# Patient Record
Sex: Female | Born: 1990 | Race: Black or African American | Hispanic: No | Marital: Single | Smoking: Never smoker
Health system: Southern US, Community
[De-identification: ages and names within clinical notes are randomized; demographics above are authoritative.]

---

## 2015-03-30 ENCOUNTER — Emergency Department (HOSPITAL_COMMUNITY)
Admission: EM | Admit: 2015-03-30 | Discharge: 2015-03-30 | Disposition: A | Discharge status: Home / Self Care | Payer: BC Managed Care – PPO | Attending: Emergency Medicine | Admitting: Emergency Medicine

## 2015-03-30 ENCOUNTER — Encounter (HOSPITAL_COMMUNITY): Payer: Self-pay | Admitting: Emergency Medicine

## 2015-03-30 DIAGNOSIS — R110 Nausea: Secondary | ICD-10-CM | POA: Diagnosis not present

## 2015-03-30 DIAGNOSIS — R1084 Generalized abdominal pain: Secondary | ICD-10-CM | POA: Diagnosis present

## 2015-03-30 DIAGNOSIS — Z3202 Encounter for pregnancy test, result negative: Secondary | ICD-10-CM | POA: Insufficient documentation

## 2015-03-30 DIAGNOSIS — A5901 Trichomonal vulvovaginitis: Secondary | ICD-10-CM | POA: Insufficient documentation

## 2015-03-30 LAB — URINALYSIS, ROUTINE W REFLEX MICROSCOPIC
Bilirubin Urine: NEGATIVE
GLUCOSE, UA: NEGATIVE mg/dL
Hgb urine dipstick: NEGATIVE
Ketones, ur: NEGATIVE mg/dL
LEUKOCYTES UA: NEGATIVE
Nitrite: NEGATIVE
PROTEIN: NEGATIVE mg/dL
SPECIFIC GRAVITY, URINE: 1.023 (ref 1.005–1.030)
Urobilinogen, UA: 2 mg/dL — ABNORMAL HIGH (ref 0.0–1.0)
pH: 6 (ref 5.0–8.0)

## 2015-03-30 LAB — COMPREHENSIVE METABOLIC PANEL
ALBUMIN: 4.8 g/dL (ref 3.5–5.2)
ALT: 18 U/L (ref 0–35)
ANION GAP: 7 (ref 5–15)
AST: 26 U/L (ref 0–37)
Alkaline Phosphatase: 51 U/L (ref 39–117)
BILIRUBIN TOTAL: 1 mg/dL (ref 0.3–1.2)
BUN: 12 mg/dL (ref 6–23)
CALCIUM: 9.1 mg/dL (ref 8.4–10.5)
CO2: 27 mmol/L (ref 19–32)
CREATININE: 0.85 mg/dL (ref 0.50–1.10)
Chloride: 104 mmol/L (ref 96–112)
GFR calc Af Amer: >90 mL/min (ref >90)
GFR calc non Af Amer: >90 mL/min (ref >90)
Glucose, Bld: 105 mg/dL — ABNORMAL HIGH (ref 70–99)
Potassium: 3.9 mmol/L (ref 3.5–5.1)
Sodium: 138 mmol/L (ref 135–145)
Total Protein: 8.1 g/dL (ref 6.0–8.3)

## 2015-03-30 LAB — WET PREP, GENITAL: YEAST WET PREP: NONE SEEN

## 2015-03-30 LAB — CBC WITH DIFFERENTIAL/PLATELET
BASOS ABS: 0 10*3/uL (ref 0.0–0.1)
Basophils Relative: 0 % (ref 0–1)
EOS ABS: 0.1 10*3/uL (ref 0.0–0.7)
Eosinophils Relative: 3 % (ref 0–5)
HCT: 43.5 % (ref 36.0–46.0)
HEMOGLOBIN: 14.4 g/dL (ref 12.0–15.0)
Lymphocytes Relative: 35 % (ref 12–46)
Lymphs Abs: 1.9 10*3/uL (ref 0.7–4.0)
MCH: 32.2 pg (ref 26.0–34.0)
MCHC: 33.1 g/dL (ref 30.0–36.0)
MCV: 97.3 fL (ref 78.0–100.0)
MONOS PCT: 15 % — AB (ref 3–12)
Monocytes Absolute: 0.8 10*3/uL (ref 0.1–1.0)
Neutro Abs: 2.5 10*3/uL (ref 1.7–7.7)
Neutrophils Relative %: 47 % (ref 43–77)
Platelets: 242 10*3/uL (ref 150–400)
RBC: 4.47 MIL/uL (ref 3.87–5.11)
RDW: 13.3 % (ref 11.5–15.5)
WBC: 5.3 10*3/uL (ref 4.0–10.5)

## 2015-03-30 LAB — POC URINE PREG, ED: PREG TEST UR: NEGATIVE

## 2015-03-30 MED ORDER — DOXYCYCLINE HYCLATE 100 MG PO CAPS: 100.0000 mg | ORAL_CAPSULE | Freq: Two times a day (BID) | ORAL | Status: DC

## 2015-03-30 MED ORDER — LIDOCAINE HCL 1 % IJ SOLN
INTRAMUSCULAR | Status: AC
  Administered 2015-03-30: 20 mL
  Filled 2015-03-30: qty 20

## 2015-03-30 MED ORDER — CEFTRIAXONE SODIUM 250 MG IJ SOLR
250.0000 mg | Freq: Once | INTRAMUSCULAR | Status: AC
  Administered 2015-03-30: 250 mg via INTRAMUSCULAR
  Filled 2015-03-30: qty 250

## 2015-03-30 MED ORDER — METRONIDAZOLE 500 MG PO TABS: 500.0000 mg | ORAL_TABLET | Freq: Two times a day (BID) | ORAL | Status: DC

## 2015-03-30 MED ORDER — ONDANSETRON 4 MG PO TBDP
4.0000 mg | ORAL_TABLET | Freq: Once | ORAL | Status: AC
  Administered 2015-03-30: 4 mg via ORAL
  Filled 2015-03-30: qty 1

## 2015-03-30 NOTE — Progress Notes (Signed)
EDCM spoke to patient at bedside.  Patient confirms she has [employer] insurance without a pcp.  [facility] with list of pcps who accept BCBS insurance within a twenty mile radius of patient's zip code [phone].  EDCM discussed importance and purpose of having a pcp.  EDCM also discussed use of ED for emergency situations and use of Urgent Care centers.  Patient thankful for resources.  No further EDCM needs at this time.

## 2015-03-30 NOTE — ED Provider Notes (Signed)
CSN: [id number]     Arrival date & time 03/30/15  1806 History   First MD Initiated Contact with Patient 03/30/15 1924     Chief Complaint  Patient presents with  . Pelvic Pain  . Abdominal Pain     (Consider location/radiation/quality/duration/timing/severity/associated sxs/prior Treatment) HPI Ruth Robles is a 24 year old female with no past medical history who presents the ER complaining of generalized abdominal discomfort, pelvic discomfort. Patient states generalized discomfort in the past 2 days, with associated nausea today. Patient states she recently has had a new sexual partner, for the past 2 weeks. Patient states she is aware that her partner has had unprotected sex recently, and she is concerned about having possibly an STD. Patient reports associated mild abdominal discomfort. Patient denies vaginal bleeding, vaginal pain, vaginal discomfort or discharge. Patient denies vomiting, diarrhea, dysuria.  History reviewed. No pertinent past medical history. History reviewed. No pertinent past surgical history. No family history on file. History  Substance Use Topics  . Smoking status: Never Smoker   . Smokeless tobacco: Current User  . Alcohol Use: Yes   OB History    No data available     Review of Systems  Constitutional: Negative for fever.  HENT: Negative for trouble swallowing.   Eyes: Negative for visual disturbance.  Respiratory: Negative for shortness of breath.   Cardiovascular: Negative for chest pain.  Gastrointestinal: Positive for nausea and abdominal pain. Negative for vomiting.  Genitourinary: Negative for dysuria.  Musculoskeletal: Negative for neck pain.  Skin: Negative for rash.  Neurological: Negative for dizziness, weakness and numbness.  Psychiatric/Behavioral: Negative.       Allergies  Review of patient's allergies indicates no known allergies.  Home Medications   Prior to Admission medications   Medication Sig Start Date End Date  Taking? Authorizing Provider  acetaminophen (TYLENOL) 500 MG tablet Take 500 mg by mouth every 6 (six) hours as needed for headache.   Yes Historical Provider, MD  doxycycline (VIBRAMYCIN) 100 MG capsule Take 1 capsule (100 mg total) by mouth 2 (two) times daily. One po bid x 14 days 03/30/15   Ladona Mow, PA-C  metroNIDAZOLE (FLAGYL) 500 MG tablet Take 1 tablet (500 mg total) by mouth 2 (two) times daily. One po bid x 14 days 03/30/15   Ladona Mow, PA-C   BP 140/90 mmHg  Pulse 85  Temp(Src) 98.3 F (36.8 C) (Oral)  Resp 16  SpO2 100%  LMP 02/15/2015 Physical Exam  Constitutional: She is oriented to person, place, and time. She appears well-developed and well-nourished. No distress.  HENT:  Head: Normocephalic and atraumatic.  Mouth/Throat: Oropharynx is clear and moist. No oropharyngeal exudate.  Eyes: Right eye exhibits no discharge. Left eye exhibits no discharge. No scleral icterus.  Neck: Normal range of motion.  Cardiovascular: Normal rate, regular rhythm and normal heart sounds.   No murmur heard. Pulmonary/Chest: Effort normal and breath sounds normal. No respiratory distress.  Abdominal: Soft. Normal appearance and bowel sounds are normal. There is generalized tenderness. There is no rigidity, no guarding, no tenderness at McBurney's point and negative Murphy's sign.  Mild, generalized tenderness. No point tenderness, rebound or guarding  Genitourinary: There is no rash, tenderness, lesion or injury on the right labia. There is no rash, tenderness, lesion or injury on the left labia. Cervix exhibits no motion tenderness, no discharge and no friability. Right adnexum displays no mass, no tenderness and no fullness. Left adnexum displays no mass, no tenderness and no fullness. No erythema, tenderness  or bleeding in the vagina. No foreign body around the vagina. No signs of injury around the vagina. Vaginal discharge found.  Mild amount of white/gray colored discharge noted in vaginal  vault. No cervical motion tenderness, friability or discharge. No adnexal tenderness. Chaperone present during entire pelvic exam.  Musculoskeletal: Normal range of motion. She exhibits no edema or tenderness.  Neurological: She is alert and oriented to person, place, and time. She has normal strength. No cranial nerve deficit or sensory deficit. Coordination normal. GCS eye subscore is 4. GCS verbal subscore is 5. GCS motor subscore is 6.  Skin: Skin is warm and dry. No rash noted. She is not diaphoretic.  Psychiatric: She has a normal mood and affect.  Nursing note and vitals reviewed.   ED Course  Procedures (including critical care time) Labs Review Labs Reviewed  WET PREP, GENITAL - Abnormal; Notable for the following:    Trich, Wet Prep MANY (*)    Clue Cells Wet Prep HPF POC FEW (*)    WBC, Wet Prep HPF POC RARE (*)    All other components within normal limits  COMPREHENSIVE METABOLIC PANEL - Abnormal; Notable for the following:    Glucose, Bld 105 (*)    All other components within normal limits  CBC WITH DIFFERENTIAL/PLATELET - Abnormal; Notable for the following:    Monocytes Relative 15 (*)    All other components within normal limits  URINALYSIS, ROUTINE W REFLEX MICROSCOPIC - Abnormal; Notable for the following:    APPearance CLOUDY (*)    Urobilinogen, UA 2.0 (*)    All other components within normal limits  RPR  HIV ANTIBODY (ROUTINE TESTING)  POC URINE PREG, ED  GC/CHLAMYDIA PROBE AMP ([place])    Imaging Review No results found.   EKG Interpretation None      MDM   Final diagnoses:  Trichomonal vaginitis    Patient here with concern for STD, mild generalized abdominal discomfort. Patient also describes nausea. Patient's symptoms improved after Zofran here. Pelvic exam was benign, there is no concern for tubo-ovarian abscess, ectopic pregnancy, ovarian torsion or PID. RPR and HIV blood draws sent as well as GC Chlamydia swab.  Patient's lab work  unremarkable for a wet prep positive for Trichomonas. Plan to treat patient with Flagyl for this. Given the fact the patient was having some mild abdominal discomfort, we'll treat for PID despite the fact that there is low concern for PID on exam. Labs otherwise unremarkable for acute pathology. Patient hemodynamically stable, well-appearing on exam and in no acute distress. Discussed treatment of PID with patient, encouraged her to not use any alcohol, encouraged follow-up with the health department as well as with a primary care provider. Discussed return precautions with patient, patient verbalized understanding and agreement of this plan. Encouraged patient to call or return to the ER if any worsening of symptoms or should she have any questions or concerns.  BP 140/90 mmHg  Pulse 85  Temp(Src) 98.3 F (36.8 C) (Oral)  Resp 16  SpO2 100%  LMP 02/15/2015  Signed,  Ladona MowJoe Lucky Alverson, PA-C 12:24 AM  Patient discussed with Dr. Tilden FossaElizabeth Rees, MD  Ladona MowJoe Tarus Briski, PA-C 03/31/15 0025  Tilden FossaElizabeth Rees, MD 03/31/15 [phone]

## 2015-03-30 NOTE — ED Notes (Signed)
Patient states she gave someone her urine already, I am looking for it

## 2015-03-30 NOTE — ED Notes (Signed)
Nurse drawing labs. 

## 2015-03-30 NOTE — ED Notes (Signed)
Pt c/o pelvic pain and white patch on roof of mouth x 4-5 days. Pt has a new sexual partner, they have been sexually active x 2 weeks. Pt denies any discharge or foul smelling odor. Pt denies vaginal bleeding or burning with urination. A&Ox4 and ambulatory.

## 2015-03-30 NOTE — Discharge Instructions (Signed)
Trichomoniasis Trichomoniasis is an infection caused by an organism called Trichomonas. The infection can affect both women and men. In women, the outer female genitalia and the vagina are affected. In men, the penis is mainly affected, but the prostate and other reproductive organs can also be involved. Trichomoniasis is a sexually transmitted infection (STI) and is most often passed to another person through sexual contact.  RISK FACTORS  Having unprotected sexual intercourse.  Having sexual intercourse with an infected partner. SIGNS AND SYMPTOMS  Symptoms of trichomoniasis in women include:  Abnormal gray-green frothy vaginal discharge.  Itching and irritation of the vagina.  Itching and irritation of the area outside the vagina. Symptoms of trichomoniasis in men include:   Penile discharge with or without pain.  Pain during urination. This results from inflammation of the urethra. DIAGNOSIS  Trichomoniasis may be found during a Pap test or physical exam. Your health care provider may use one of the following methods to help diagnose this infection:  Examining vaginal discharge under a microscope. For men, urethral discharge would be examined.  Testing the pH of the vagina with a test tape.  Using a vaginal swab test that checks for the Trichomonas organism. A test is available that provides results within a few minutes.  Doing a culture test for the organism. This is not usually needed. TREATMENT   You may be given medicine to fight the infection. Women should inform their health care provider if they could be or are pregnant. Some medicines used to treat the infection should not be taken during pregnancy.  Your health care provider may recommend over-the-counter medicines or creams to decrease itching or irritation.  Your sexual partner will need to be treated if infected. HOME CARE INSTRUCTIONS   Take medicines only as directed by your health care provider.  Take  over-the-counter medicine for itching or irritation as directed by your health care provider.  Do not have sexual intercourse while you have the infection.  Women should not douche or wear tampons while they have the infection.  Discuss your infection with your partner. Your partner may have gotten the infection from you, or you may have gotten it from your partner.  Have your sex partner get examined and treated if necessary.  Practice safe, informed, and protected sex.  See your health care provider for other STI testing. SEEK MEDICAL CARE IF:   You still have symptoms after you finish your medicine.  You develop abdominal pain.  You have pain when you urinate.  You have bleeding after sexual intercourse.  You develop a rash.  Your medicine makes you sick or makes you throw up (vomit). MAKE SURE YOU:  Understand these instructions.  Will watch your condition.  Will get help right away if you are not doing well or get worse. Document Released: 05/28/2001 Document Revised: 04/18/2014 Document Reviewed: 09/13/2013 [facility] Patient Information 2015 [employer], [place]. This information is not intended to replace advice given to you by your health care provider. Make sure you discuss any questions you have with your health care provider.   Emergency Department Resource Guide 1) Find a Doctor and Pay Out of Pocket Although you won't have to find out who is covered by your insurance plan, it is a good idea to ask around and get recommendations. You will then need to call the office and see if the doctor you have chosen will accept you as a new patient and what types of options they offer for patients who are  self-pay. Some doctors offer discounts or will set up payment plans for their patients who do not have insurance, but you will need to ask so you aren't surprised when you get to your appointment.  2) Contact Your Local Health Department Not all health departments have doctors  that can see patients for sick visits, but many do, so it is worth a call to see if yours does. If you don't know where your local health department is, you can check in your phone book. The CDC also has a tool to help you locate your state's health department, and many state websites also have listings of all of their local health departments.  3) Find a Walk-in Clinic If your illness is not likely to be very severe or complicated, you may want to try a walk in clinic. These are popping up all over the country in pharmacies, drugstores, and shopping centers. They're usually staffed by nurse practitioners or physician assistants that have been trained to treat common illnesses and complaints. They're usually fairly quick and inexpensive. However, if you have serious medical issues or chronic medical problems, these are probably not your best option.  No Primary Care Doctor: - Call Health Connect at  [phone] - they can help you locate a primary care doctor that  accepts your insurance, provides certain services, etc. - Physician Referral Service- [phone]  Chronic Pain Problems: Organization         Address  Phone   Notes  [facility]  [phone] Patients need to be referred by their primary care doctor.   Medication Assistance: Organization         Address  Phone   Notes  [facility] Medication [facility] [address] [place] [place], [place] [address] [phone] --Must be a resident of [facility] -- Must have NO insurance coverage whatsoever (no Medicaid/ Medicare, etc.) -- The pt. MUST have a primary care doctor that directs their care regularly and follows them in the community   MedAssist  [phone]   [employer]  [phone]    Agencies that provide inexpensive medical care: Organization         Address  Phone   Notes  [facility]  [phone]   [facility]    [phone]   [facility] [address] [place], [place] [address] [phone]   [facility] [address] [place] [place] [phone]   Planned Parenthood    [phone]   [facility]    [phone]   [facility]  [address], [place] Phone:  [phone], Fax:  [phone] Hours of Operation:  9 am - 6 pm, M-F.  Also accepts Medicaid/Medicare and self-pay.  [facility]  [address], [place] Phone: [phone], Fax: [phone]. Hours of Operation:  8:30 am - 5:30 pm, M-F.  Also accepts Medicaid and self-pay.  [facility] [place] [address], [place] Phone: [phone]   [facility] [address] Natasha Bence [place], [place] [phone], Ext. [phone] Mondays & Thursdays: 7-9 AM.  First 15 patients are seen on a first come, first serve basis.    Medicaid-accepting [facility] Providers:  Organization         Address  Phone   Notes  [facility] [address], [address], [place] [phone] Also accepts self-pay patients.  [facility] [address] [place], [place]  [phone]) [phone]   [facility] [address] [place] [address], [place] [phone]   [facility] [address] [place] [address], [place] [phone]) [phone]   Renaye RakersVeita Bland [address], [place]   [phone] Only accepts [place] Access [place]icaid patients after they have their name applied to their card.   Self-Pay (no insurance) in [facility]:  Organization         Address  Phone   Notes  Sickle Cell Patients, [facility] [address], [place] [phone]) [phone]   [facility] [address], [place] [phone]) [phone]   [facility] [place]  [address], [place] [phone]   [facility]/Dr.  Osei-Bonsu  [address] [place] [address], [place] or [phone], [place] [phone] Phone number for both [place] and [place] locations is the same.  [facility] [address], [place] [phone]) [phone]   [facility] [address] [place] [address], [place] or [address] [phone]   [facility] [address], [place] [phone]) [phone], phone; [phone], fax Sees patients 1st and 3rd Saturday of every month.  Must not qualify for public or private insurance (i.e. Medicaid, Medicare, [place] Health Choice, Veterans' Benefits)  Household income should be no more than 200% of the poverty level The clinic cannot treat you if you are pregnant or think you are pregnant  Sexually transmitted diseases are not treated at the clinic.    Dental Care: Organization         Address  Phone  Notes  [facility] [address], [place] [phone] Accepts children up to age 26 who are enrolled in [place]icaid or [place] Health Choice; pregnant women with a Medicaid card; and children who have applied for Medicaid or [place] Health Choice, but were declined, whose parents can pay a reduced fee at time of service.  [facility] [place]  [address], [place] [phone] Accepts children up to age 26 who are enrolled in [place] or [place] Health Choice; pregnant women with a Medicaid card; and children who have applied for Medicaid or [place] Health Choice, but were declined, whose parents can pay a reduced fee at time of service.  [facility]  [address], [place] [phone] Patients are seen by appointment only. Walk-ins are not accepted. [facility] will see patients 218 years of age and older. Monday - Tuesday (8am-5pm) Most Wednesdays (8:30-5pm) $30 per visit, cash only  [facility]  [facility]  [address], [facility] [phone] Patients are seen by appointment only. Walk-ins are not accepted. [facility] will see patients 26 years of age and older. One Wednesday Evening (Monthly: Volunteer Based).  $30 per visit, cash only  [employer] [facility]  [phone] for adults; Children under age 624, call Graduate Pediatric Dentistry at [phone]. Children aged 314-14, please call [phone] to request a pediatric application.  Dental services are provided in all areas of dental care including fillings, crowns and bridges, complete and partial dentures, implants, gum treatment, root canals, and  extractions. Preventive care is also provided. Treatment is provided to both adults and children. Patients are selected via a lottery and there is often a waiting list.   [facility] [address] [place]  [phone] [email or web address]   [facility] [address] [place], [place] [phone], Ext. [phone] Second and Fourth Thursday of each month, opens at 6:30 AM; Clinic ends at 9 AM.  Patients are seen on a first-come first-served basis, and a limited number are seen during each clinic.   [facility]  [address] [place] [place], [place] [phone]   Eligibility Requirements You must have lived in [place], [place], or [place] counties for at least the last three months.   You cannot be eligible for state or federal sponsored [place], including [employer], [place], or [employer].   You generally cannot be eligible for healthcare insurance through your employer.    How to apply: Eligibility screenings are held every Tuesday and Wednesday afternoon from 1:00 pm until 4:00 pm. You do not need an appointment for the interview!  [facility] [address], [place], [place] [phone]   [facility]  [phone]   [facility]  Health Department  [phone]   [facility]  [phone]    Behavioral Health Resources in the Community: Intensive Outpatient Programs Organization         Address  Phone  Notes  [facility] [address] [place], [place] [phone]   [facility] Outpatient [address] [place], [place] [phone]   [facility]: [facility] [address] [place], [place]  [phone]   [facility] [address]  [place], [place] [phone] or [phone]   Substance Abuse Resources Organization         Address  Phone  Notes  Alcohol [facility] Drug Services  [phone]   [facility]  [phone]   The [place]  [phone]   Floydene Flock  [phone]   Residential & Outpatient Substance Abuse Program  [phone]   Psychological Services Organization         Address  Phone  Notes  [facility] Behavioral Health  [phone]   [facility]  [phone]   [facility] [address] [place] [phone] or [phone]    Mobile Crisis Teams Organization         Address  Phone  Notes  [facility], Mobile Crisis Care Unit  [phone]   [facility]  [address] [place], [place] [phone]   Doristine Locks [address] [place] [phone]    Self-Help/Support Groups Organization         Address  Phone             Notes  [facility] [place] - variety of support groups  [phone]- [phone] Call for more information  Narcotics Anonymous (NA), [facility] [address], [employer] [place]  2 meetings at this location   Statistician         Address  Phone  Notes  [facility] [address],    [place]  [phone]   [facility]  [address] [place] [address], [place], [place] [phone]   [facility] Treatment  Facility [address] [place], [place] [phone] Admissions: 8am-3pm M-F  [facility] [address]    [place], [place] [phone]   [facility] [address], [place], [place] [phone]   [facility] [address]  [place], [place] [phone]   [facility] - Intensive Outpatient [address], [place], [place] [phone]   [facility] ([facility]iction [facility] [address]  [place], [place] [phone] or [phone]   [facility] (RT[facility]) [address] [place], [place] [phone] Accepts Medicaid  [facility] [address]  [place] [phone] Substance Abuse/Addiction Treatment   [facility] Behavioral Health Resources Organization         Address  Phone  Notes  [facility]  [phone]) [phone]   Angie FavaJulie Brannon, PhD [address] [place] [address] [place], [place]   [phone] or [phone](336) [phone]   [facility]    [address] [place], [place] [phone](336) [phone]   [facility] [address], [place], [place] [phone]) [phone] Insurance/Medicaid/sponsorship through [facility]  [facility] Families [address]                                    [place], [place] [phone]) [phone] Therapy/tele-psych/case  [facility] [address].   [place], [place] [phone]) [phone]    Dr. Lolly MustacheArfeen  [phone]) [phone]   [facility]  [employer] [facility] 1) [address], [place] 2) [address], [place] 3)  [address], [place] [phone]) [phone]) [phone]  [phone]) [phone]   [facility] Child Abuse Hotline [phone]) [phone] or [phone](336) [phone] (After Hours)

## 2015-03-31 LAB — RPR: RPR: NONREACTIVE

## 2015-03-31 LAB — HIV ANTIBODY (ROUTINE TESTING W REFLEX): HIV SCREEN 4TH GENERATION: NONREACTIVE

## 2015-03-31 LAB — GC/CHLAMYDIA PROBE AMP (~~LOC~~) NOT AT ARMC
CHLAMYDIA, DNA PROBE: NEGATIVE
NEISSERIA GONORRHEA: NEGATIVE

## 2015-10-28 ENCOUNTER — Emergency Department (HOSPITAL_COMMUNITY)
Admission: EM | Admit: 2015-10-28 | Discharge: 2015-10-28 | Disposition: A | Discharge status: Home / Self Care | Payer: BC Managed Care – PPO | Attending: Emergency Medicine | Admitting: Emergency Medicine

## 2015-10-28 ENCOUNTER — Encounter (HOSPITAL_COMMUNITY): Payer: Self-pay | Admitting: *Deleted

## 2015-10-28 DIAGNOSIS — Z202 Contact with and (suspected) exposure to infections with a predominantly sexual mode of transmission: Secondary | ICD-10-CM | POA: Diagnosis present

## 2015-10-28 DIAGNOSIS — N898 Other specified noninflammatory disorders of vagina: Secondary | ICD-10-CM | POA: Insufficient documentation

## 2015-10-28 DIAGNOSIS — Z3202 Encounter for pregnancy test, result negative: Secondary | ICD-10-CM | POA: Diagnosis not present

## 2015-10-28 DIAGNOSIS — N949 Unspecified condition associated with female genital organs and menstrual cycle: Secondary | ICD-10-CM

## 2015-10-28 LAB — URINALYSIS, ROUTINE W REFLEX MICROSCOPIC
Bilirubin Urine: NEGATIVE
GLUCOSE, UA: NEGATIVE mg/dL
Hgb urine dipstick: NEGATIVE
Ketones, ur: NEGATIVE mg/dL
LEUKOCYTES UA: NEGATIVE
NITRITE: NEGATIVE
PH: 7.5 (ref 5.0–8.0)
Protein, ur: NEGATIVE mg/dL
SPECIFIC GRAVITY, URINE: 1.014 (ref 1.005–1.030)
Urobilinogen, UA: 1 mg/dL (ref 0.0–1.0)

## 2015-10-28 LAB — WET PREP, GENITAL
Clue Cells Wet Prep HPF POC: NONE SEEN
TRICH WET PREP: NONE SEEN
WBC WET PREP: NONE SEEN
Yeast Wet Prep HPF POC: NONE SEEN

## 2015-10-28 LAB — POC URINE PREG, ED: Preg Test, Ur: NEGATIVE

## 2015-10-28 MED ORDER — AZITHROMYCIN 250 MG PO TABS
1000.0000 mg | ORAL_TABLET | Freq: Once | ORAL | Status: AC
  Administered 2015-10-28: 1000 mg via ORAL
  Filled 2015-10-28: qty 4

## 2015-10-28 MED ORDER — CEFTRIAXONE SODIUM 250 MG IJ SOLR
250.0000 mg | Freq: Once | INTRAMUSCULAR | Status: AC
  Administered 2015-10-28: 250 mg via INTRAMUSCULAR
  Filled 2015-10-28: qty 250

## 2015-10-28 MED ORDER — STERILE WATER FOR INJECTION IJ SOLN
INTRAMUSCULAR | Status: AC
  Filled 2015-10-28: qty 10

## 2015-10-28 NOTE — ED Notes (Signed)
Requesting to be screened for std, having itching and bumps x 2 days.

## 2015-10-28 NOTE — ED Provider Notes (Signed)
CSN: [id number]     Arrival date & time 10/28/15  1002 History   First MD Initiated Contact with Patient 10/28/15 1010     Chief Complaint  Patient presents with  . SEXUALLY TRANSMITTED DISEASE     (Consider location/radiation/quality/duration/timing/severity/associated sxs/prior Treatment) The history is provided by the patient, medical records and a significant other. No language interpreter was used.  Ruth Robles is a 24 y.o. female  with a PMH of trich presents to the Emergency Department complaining of vaginal itching and burning x 2 days. Associated symptoms include lower abdominal "pressure" and urinary frequency. Pt. Denies fever, chills, vaginal bleeding, vaginal discharge. She was diagnosed with trich in April and finished full course of Flagyl. Sexually active with long term female partner, no new sexual partners.    History reviewed. No pertinent past medical history. History reviewed. No pertinent past surgical history. History reviewed. No pertinent family history. Social History  Substance Use Topics  . Smoking status: Never Smoker   . Smokeless tobacco: Current User  . Alcohol Use: Yes   OB History    No data available     Review of Systems  Constitutional: Negative.   HENT: Negative for congestion, rhinorrhea and sore throat.   Eyes: Negative for visual disturbance.  Respiratory: Negative for cough, shortness of breath and wheezing.   Cardiovascular: Negative.   Gastrointestinal: Negative for nausea, vomiting, abdominal pain, diarrhea and constipation.  Genitourinary: Positive for frequency and vaginal pain. Negative for hematuria, vaginal bleeding and vaginal discharge.  Musculoskeletal: Negative for myalgias, back pain, arthralgias and neck pain.  Skin: Negative for rash.  Neurological: Negative for dizziness, weakness and headaches.      Allergies  Review of patient's allergies indicates no known allergies.  Home Medications   Prior to Admission  medications   Medication Sig Start Date End Date Taking? Authorizing Provider  acetaminophen (TYLENOL) 500 MG tablet Take 500 mg by mouth every 6 (six) hours as needed for headache.   Yes Historical Provider, MD  doxycycline (VIBRAMYCIN) 100 MG capsule Take 1 capsule (100 mg total) by mouth 2 (two) times daily. One po bid x 14 days Patient not taking: Reported on 10/28/2015 03/30/15   Ladona Mow, PA-C  metroNIDAZOLE (FLAGYL) 500 MG tablet Take 1 tablet (500 mg total) by mouth 2 (two) times daily. One po bid x 14 days Patient not taking: Reported on 10/28/2015 03/30/15   Ladona Mow, PA-C   BP 111/74 mmHg  Pulse 82  Temp(Src) 98.9 F (37.2 C) (Oral)  Resp 18  SpO2 99%  LMP 10/17/2015 Physical Exam  Constitutional: She is oriented to person, place, and time. She appears well-developed and well-nourished.  Alert and in no acute distress  HENT:  Head: Normocephalic and atraumatic.  Cardiovascular: Normal rate, regular rhythm, normal heart sounds and intact distal pulses.  Exam reveals no gallop and no friction rub.   No murmur heard. Pulmonary/Chest: Effort normal and breath sounds normal. No respiratory distress. She has no wheezes. She has no rales. She exhibits no tenderness.  Abdominal: She exhibits no mass. There is no rebound and no guarding.  Abdomen soft, non-distended Suprapubic discomfort with palpation.  Bowel sounds positive in all four quadrants   Genitourinary: Uterus normal.    Cervix exhibits discharge. Cervix exhibits no motion tenderness. Right adnexum displays no mass and no tenderness. Left adnexum displays no mass and no tenderness. No erythema, tenderness or bleeding in the vagina.  White discharge present on cervical exam   Musculoskeletal:  She exhibits no edema.  Neurological: She is alert and oriented to person, place, and time.  Psychiatric: She has a normal mood and affect. Her behavior is normal. Judgment and thought content normal.  Nursing note and vitals  reviewed.   ED Course  Procedures (including critical care time) Labs Review Labs Reviewed  URINALYSIS, ROUTINE W REFLEX MICROSCOPIC (NOT AT [facility]) - Abnormal; Notable for the following:    APPearance HAZY (*)    All other components within normal limits  WET PREP, GENITAL  RPR  HIV ANTIBODY (ROUTINE TESTING)  POC URINE PREG, ED  GC/CHLAMYDIA PROBE AMP ([place]) NOT AT [facility]    Imaging Review No results found. I have personally reviewed and evaluated these images and lab results as part of my medical decision-making.   EKG Interpretation None      MDM   Final diagnoses:  Vaginal burning   Ruth Robles presents with two days of vaginal itching, burning and requesting STD check.   Labs: Negative upreg, negative wet prep; reassuring UA Labs performed here are negative for STD's. HIV, RPR, and G&C were sent. Patient informed that she will received phone call if positive. Due to concerning symptoms/exam and risk factors, discussed prophylactic treatment of STI with patient who agreed with plan of care. Will recommend GYN follow up, especially if symptoms/bumps do not improve   [facility] Center[facility]ime Pilcher Llewelyn Sheaffer, PA-C 10/28/15 1219  Alvira MondayErin Schlossman, MD 10/30/15 2242

## 2015-10-28 NOTE — Discharge Instructions (Signed)
1. Medications: usual home medications 2. Treatment: rest, drink plenty of fluids, practice safe-sex habits 3. Follow Up: Please follow up with your primary doctor in 3 days for discussion of your diagnoses and further evaluation after today's visit; if you do not have a primary care doctor use the resource guide provided to find one; Please return to the ER for worsening symptoms, high fevers or persistent vomiting.  You have been tested for HIV, syphilis, chlamydia and gonorrhea. These results will be available in approximately 3 days. You will be notified if they are positive.   If your results are positive you need to notify all sexual partners so they can be treated as well. The website [email or web address] can be used to send anonymous text messages or emails to alert sexual contacts.   Follow up with your doctor, or OBGYN in regards to today's visit.   SEEK IMMEDIATE MEDICAL CARE IF:  You develop an oral temperature above 102 F (38.9 C), not controlled by medications or lasting more than 2 days.  You develop an increase in pain.  You develop any type of abnormal discharge.  You develop vaginal bleeding and it is not time for your period.  You develop painful intercourse.    Emergency Department Resource Guide 1) Find a Doctor and Pay Out of Pocket Although you won't have to find out who is covered by your insurance plan, it is a good idea to ask around and get recommendations. You will then need to call the office and see if the doctor you have chosen will accept you as a new patient and what types of options they offer for patients who are self-pay. Some doctors offer discounts or will set up payment plans for their patients who do not have insurance, but you will need to ask so you aren't surprised when you get to your appointment.  2) Contact Your Local Health Department Not all health departments have doctors that can see patients for sick visits, but many do, so it is  worth a call to see if yours does. If you don't know where your local health department is, you can check in your phone book. The CDC also has a tool to help you locate your state's health department, and many state websites also have listings of all of their local health departments.  3) Find a Walk-in Clinic If your illness is not likely to be very severe or complicated, you may want to try a walk in clinic. These are popping up all over the country in pharmacies, drugstores, and shopping centers. They're usually staffed by nurse practitioners or physician assistants that have been trained to treat common illnesses and complaints. They're usually fairly quick and inexpensive. However, if you have serious medical issues or chronic medical problems, these are probably not your best option.  No Primary Care Doctor: - Call Health Connect at  [phone] - they can help you locate a primary care doctor that  accepts your insurance, provides certain services, etc. - Physician Referral Service- [phone]  Chronic Pain Problems: Organization         Address  Phone   Notes  [facility]  [phone] Patients need to be referred by their primary care doctor.   Medication Assistance: Organization         Address  Phone   Notes  [facility] Medication [facility] [address] [place] [place], [place] [address] [phone] --Must be a resident of [address]  [place] -- Must have NO insurance coverage whatsoever (no Medicaid/ Medicare, etc.) -- The pt. MUST have a primary care doctor that directs their care regularly and follows them in the community   [facility]  [phone]   [employer]  [phone]    Agencies that provide inexpensive medical care: Organization         Address  Phone   Notes  [facility]  [phone]   [facility]    [phone]   [facility] [address] [place],  [place] [address] [phone]   [facility] [address], [place] [phone]   Planned Parenthood    [phone]   [facility]    [phone]   [facility]  [address], [place] Phone:  [phone], Fax:  [phone] Hours of Operation:  9 am - 6 pm, M-F.  Also accepts Medicaid/Medicare and self-pay.  [facility]  [address], [place] Phone: [phone], Fax: [phone]. Hours of Operation:  8:30 am - 5:30 pm, M-F.  Also accepts Medicaid and self-pay.  [facility] [place] [address] [place] Phone: [phone]   [facility] [address] Natasha Bence [place], [place] [phone], Ext. [phone] Mondays & Thursdays: 7-9 AM.  First 15 patients are seen on a first come, first serve basis.    Medicaid-accepting [facility] Providers:  Organization         Address  Phone   Notes  [facility] [address], [place] [phone] Also accepts self-pay patients.  [facility] [address] Laurell Josephs [place], [place]  [phone]   [facility] [address], [place] [phone]   [facility] [address], [place] [phone]   Renaye Rakers [address], [place]   [phone] Only accepts [place] Access [place] patients after they have their name applied to their card.   Self-Pay (no insurance) in [facility]:  Organization         Address  Phone   Notes  Sickle Cell Patients, [facility] [address] [place], [place] [phone]   [facility] [address] [place], [place] [phone]   [facility] [place]  [address], [place] [phone]   [facility]/Dr. Osei-Bonsu  [address] [place] or [address], [place] [phone] Phone number for both [place] and [place] locations is the same.  [facility] [address] [place] [phone]   [facility] [address], [place] or [address] [phone]   [facility] [address], [place] [phone], phone; [phone], fax Sees patients 1st and 3rd Saturday of every month.  Must not qualify for public or private insurance (i.e. Medicaid, Medicare, [place] Health Choice, Veterans' Benefits)  Household income should be no more than 200% of the poverty level The clinic cannot treat you if you are pregnant or think you are pregnant  Sexually transmitted diseases are not treated at the clinic.    Dental Care: Organization         Address  Phone  Notes  [facility] [address], [place] [phone] Accepts children up to age 26 who are enrolled in [place]icaid or [place] Health Choice; pregnant women with a Medicaid card; and children who have applied for Medicaid or [place] Health Choice, but were declined, whose parents can pay a reduced fee at time of service.  [facility] [place]  [address], [place] [phone] Accepts children up to age 26 who are enrolled in [place] or [place] Health Choice; pregnant women with a Medicaid card; and children who have applied for Medicaid or [place] Health Choice, but were declined, whose parents can pay a reduced fee at time of service.  [facility]  [address], [place] [phone] Patients are seen by appointment only. Walk-ins are not accepted. [facility] will see patients 26 years of age and older. Monday - Tuesday (8am-5pm) Most Wednesdays (8:30-5pm) $30 per visit, cash only  [facility]  [address], [facility] [phone]  Patients are seen by appointment only. Walk-ins are not accepted. [facility] will see patients 26 years of age and older. One Wednesday Evening (Monthly: Volunteer Based).  $30 per visit, cash only  [employer] [facility]  [phone] for adults; Children under age 754, call Graduate Pediatric Dentistry at [phone]. Children aged 94-14, please call [phone] to request a pediatric application.  Dental services are provided in all areas of dental care including fillings, crowns and bridges, complete and partial dentures, implants, gum treatment, root canals, and extractions. Preventive care is also provided. Treatment is provided to both adults and children. Patients are selected via a lottery and there is often a waiting list.   [facility] [address], [place]  [phone] [email or web address]   [facility] [address], [place], [place] [phone], Ext. [phone] Second and Fourth Thursday of each month, opens at 6:30 AM; Clinic ends at 9 AM.  Patients are seen on a first-come first-served basis, and a limited number are seen during each clinic.   [facility]  [address], [place], [place] [phone]   Eligibility Requirements You must have lived in [place], [place], or [place] counties for at least the last three months.   You cannot be eligible for state or federal sponsored [place] Cityhealthcare insurance, including CIGNAVeterans Administration, [place]icaid, or [employer]icare.   You generally cannot be eligible for healthcare insurance through your employer.    How to apply: Eligibility screenings are held every Tuesday and Wednesday afternoon from 1:00 pm until 4:00 pm. You do not need an appointment for the interview!  [facility] [address], [place], [place] [phone]   [facility]  [phone]   [facility]  [phone]   [facility]   [phone]    Behavioral Health Resources in the Community: Intensive Outpatient Programs Organization         Address  Phone  Notes  [facility] [address], [place], [place] [phone]   [facility] [address], [place], [place] [phone]   [facility] [address], [place], [place]  [phone]   [facility] [address],  [place], [place] [phone] or [phone]   Substance Abuse Resources Organization  Address  Phone  Notes  [facility]  [phone]   [facility]  [phone]   [facility] [place]  [phone]   Floydene Flock  [phone]   Residential & Outpatient Substance Abuse Program  [phone]   Psychological Services Organization         Address  Phone  Notes  [facility]  [phone]   [facility]  [phone]   [facility] [address], [place] [phone] or [phone]    Mobile Crisis Teams Organization         Address  Phone  Notes  [facility], Mobile Crisis Care Unit  [phone]   [facility]  [address] [place], [place] [phone]   Doristine Locks [address] [place] [phone]    Self-Help/Support Groups Organization         Address  Phone             Notes  [facility] [place] - variety of support groups  [phone]- I7437963 Call for more information  Narcotics Anonymous (NA), [facility] [address], [employer] [place]  2 meetings at this location   Statistician         Address  Phone  Notes  [facility] [address],    [place]  [phone]   [facility]  [address] [place] [address], [place], [place] [phone]   [facility] [address] [place], [place] [phone] Admissions: 8am-3pm M-F   [facility] [address]    [place], [place] [phone]   [facility] [address] [place], [place], [place] [phone]   [facility] [address]  [place], [place] [phone]   [facility] - Intensive Outpatient [address]ure[address] Joseph[address], [place], [place] [phone]   [facility] [address] [place]  [place], [place] [phone] or [phone]   [facility] ([facility]) [address] [place], [place] [phone] Accepts Medicaid  [facility] [address]  [place] [phone] Substance Abuse/Addiction Treatment   [facility] [place] Organization         Address  Phone  Notes  [facility]  [phone]   Angie Fava, PhD [address] Ervin Knack [place], [place]   [phone] or [phone]   [facility]al   [address] [place], [place] [phone]   [facility] [address], [place], [place] [phone] Insurance/Medicaid/sponsorship through [facility] Families [address]                                    [place], [place] [phone] Therapy/tele-psych/case  [facility] [address], [place] [phone]    Dr. Lolly Mustache  [phone]   [facility]  [employer] [facility] Dept. 1) [address] [place] 2) [address], [place] 3)  [address], [place] [phone]  [phone]   [facility] Child Abuse Hotline 930-318-1268 or 8306205518 (After Hours)

## 2015-10-29 LAB — RPR: RPR: NONREACTIVE

## 2015-10-29 LAB — HIV ANTIBODY (ROUTINE TESTING W REFLEX): HIV Screen 4th Generation wRfx: NONREACTIVE

## 2015-10-30 LAB — GC/CHLAMYDIA PROBE AMP (~~LOC~~) NOT AT ARMC
Chlamydia: NEGATIVE
Neisseria Gonorrhea: NEGATIVE

## 2018-06-17 ENCOUNTER — Encounter (HOSPITAL_COMMUNITY): Payer: Self-pay | Admitting: Radiology

## 2018-06-17 ENCOUNTER — Other Ambulatory Visit: Payer: Self-pay

## 2018-06-17 ENCOUNTER — Emergency Department (HOSPITAL_COMMUNITY)
Admission: EM | Admit: 2018-06-17 | Discharge: 2018-06-17 | Disposition: A | Discharge status: Home / Self Care | Payer: BC Managed Care – PPO | Attending: Emergency Medicine | Admitting: Emergency Medicine

## 2018-06-17 ENCOUNTER — Emergency Department (HOSPITAL_COMMUNITY): Payer: BC Managed Care – PPO

## 2018-06-17 DIAGNOSIS — Y999 Unspecified external cause status: Secondary | ICD-10-CM | POA: Diagnosis not present

## 2018-06-17 DIAGNOSIS — Z79899 Other long term (current) drug therapy: Secondary | ICD-10-CM | POA: Insufficient documentation

## 2018-06-17 DIAGNOSIS — Y939 Activity, unspecified: Secondary | ICD-10-CM | POA: Insufficient documentation

## 2018-06-17 DIAGNOSIS — S0083XA Contusion of other part of head, initial encounter: Secondary | ICD-10-CM | POA: Diagnosis not present

## 2018-06-17 DIAGNOSIS — W010XXA Fall on same level from slipping, tripping and stumbling without subsequent striking against object, initial encounter: Secondary | ICD-10-CM | POA: Diagnosis not present

## 2018-06-17 DIAGNOSIS — Y92002 Bathroom of unspecified non-institutional (private) residence single-family (private) house as the place of occurrence of the external cause: Secondary | ICD-10-CM | POA: Diagnosis not present

## 2018-06-17 DIAGNOSIS — W19XXXA Unspecified fall, initial encounter: Secondary | ICD-10-CM

## 2018-06-17 DIAGNOSIS — S0990XA Unspecified injury of head, initial encounter: Secondary | ICD-10-CM | POA: Diagnosis present

## 2018-06-17 NOTE — Discharge Instructions (Addendum)
Your CT scan today does not show any broken bones or significant trauma. Home to rest. Motrin and Tylenol as directed. Apply ice for 20 minutes at a time. Recheck with PCP, referral given if needed. Return to ER for worsening or concerning symptoms.

## 2018-06-17 NOTE — ED Triage Notes (Signed)
Patient ambulatory to triage with c/o fall in shower on Saturday, hit right side of face. Patient reports increasing swelling and bruising to right side of eye and ringing in right ear. Patient denies blurred vision, drainage from ear. No obvious swelling noted. Patient alert and oriented.

## 2018-06-17 NOTE — ED Provider Notes (Signed)
[facility]-EMERGENC[facility] DEPT Provider Note   CSN: [id number] Arrival date & time: 06/17/18  1122     History   Chief Complaint Chief Complaint  Patient presents with  . Fall  . Facial Pain    HPI Ruth Robles is a 27 y.o. female.  27 year old female presents with complaint of right-sided facial pain.  Patient states that she slipped and fell getting out of the shower late Saturday night/early Sunday morning.  Patient states that she struck her right side of her face on the bathroom floor.  She denies loss of consciousness, states that she stood right back up after the fall happened.  Patient states that she continues to have right-sided facial pain and swelling, notes that she had ringing in her right ear this morning while brushing her teeth, none since.  She denies feeling of instability in her jaw with eating, denies injury to her teeth, denies changes in vision or pain with movement of her eyes, denies neck or back pain, nausea or vomiting, confusion or feeling disoriented.  Patient has been taking Tylenol for her pain with limited relief.  No other injuries, complaints, concerns.  Patient denies any possibility of pregnancy.     History reviewed. No pertinent past medical history.  There are no active problems to display for this patient.   No past surgical history on file.   OB History   None      Home Medications    Prior to Admission medications   Medication Sig Start Date End Date Taking? Authorizing Provider  acetaminophen (TYLENOL) 500 MG tablet Take 500 mg by mouth every 6 (six) hours as needed for headache.   Yes [provider]    Family History No family history on file.  Social History Social History   Tobacco Use  . Smoking status: Never Smoker  . Smokeless tobacco: Current User  Substance Use Topics  . Alcohol use: Yes  . Drug use: Yes    Types: Marijuana     Allergies   Patient has no known  allergies.   Review of Systems Review of Systems  Constitutional: Negative for fever.  HENT: Positive for ear pain and tinnitus. Negative for dental problem.   Eyes: Negative for visual disturbance.  Gastrointestinal: Negative for nausea and vomiting.  Musculoskeletal: Negative for back pain, gait problem and neck pain.  Skin: Negative for rash and wound.  Allergic/Immunologic: Negative for immunocompromised state.  Neurological: Negative for dizziness, weakness and headaches.  Hematological: Does not bruise/bleed easily.  Psychiatric/Behavioral: Negative for agitation and confusion.  All other systems reviewed and are negative.    Physical Exam Updated Vital Signs BP 120/87 (BP Location: Left Arm)   Pulse (!) 118   Temp 99 F (37.2 C) (Oral)   Resp 18   Ht 5\' 1"  (1.549 m)   Wt 95.3 kg (210 lb)   LMP 06/11/2018 (Exact Date)   SpO2 100%   BMI 39.68 kg/m   Physical Exam  Constitutional: She is oriented to person, place, and time. She appears well-developed and well-nourished. No distress.  HENT:  Head:    Left Ear: External ear normal.  Nose: Nose normal.  Mouth/Throat: Oropharynx is clear and moist. Normal dentition. No oropharyngeal exudate.  Eyes: Pupils are equal, round, and reactive to light. Conjunctivae and EOM are normal.  Neck: Normal range of motion. Neck supple.  Cardiovascular: Intact distal pulses.  Pulmonary/Chest: Effort normal.  Musculoskeletal:       Cervical back: Normal.  Thoracic back: Normal.       Lumbar back: Normal.  Neurological: She is alert and oriented to person, place, and time.  Skin: Skin is warm and dry. She is not diaphoretic.  Psychiatric: She has a normal mood and affect. Her behavior is normal.  Nursing note and vitals reviewed.    ED Treatments / Results  Labs (all labs ordered are listed, but only abnormal results are displayed) Labs Reviewed - No data to display  EKG None  Radiology Ct Maxillofacial Wo  Cm  Result Date: 06/17/2018 CLINICAL DATA:  Recent fall with right facial pain, initial encounter EXAM: CT MAXILLOFACIAL WITHOUT CONTRAST TECHNIQUE: Multidetector CT imaging of the maxillofacial structures was performed. Multiplanar CT image reconstructions were also generated. COMPARISON:  None. FINDINGS: Osseous: Bony structures are within normal limits. No acute fracture is noted. Dental caries are noted particularly on the left involving the left second mandibular molar. Orbits: Negative. No traumatic or inflammatory finding. Sinuses: Clear. Soft tissues: Negative. Limited intracranial: No significant or unexpected finding. IMPRESSION: No acute bony or soft tissue abnormality is noted. Dental caries are noted on the left as described. Electronically Signed   By: Alcide CleverMark  Lukens M.D.   On: 06/17/2018 13:40    Procedures Procedures (including critical care time)  Medications Ordered in ED Medications - No data to display   Initial Impression / Assessment and Plan / ED Course  I have reviewed the triage vital signs and the nursing notes.  Pertinent labs & imaging results that were available during my care of the patient were reviewed by me and considered in my medical decision making (see chart for details).  Clinical Course as of Jun 17 1353  Wed Jun 17, 2018  1351 27 yo female presents with right side facial pain and swelling after she slipped and fell getting out of the shower 4 days ago. No LOC, no other injuries. NO neck or back tenderness. Right side maxillary area TTP with mild swelling and ecchymosis. CT maxillofacial ordered to evaluate for fracture and is negative for fracture. Discussed results including dental carries with patient. Given work note as requested, follow up with PCP (referral given), ER if needed. Patient verbalizes dc instructions and POC.    [LM]    Clinical Course User Index [LM] Jeannie FendMurphy, Kamyrah Feeser A, PA-C    Final Clinical Impressions(s) / ED Diagnoses   Final  diagnoses:  Contusion of face, initial encounter  Fall, initial encounter    ED Discharge Orders    None       Jeannie FendMurphy, Tristan Proto A, PA-C 06/17/18 1354    Terrilee FilesButler, Michael C, MD 06/18/18 [phone]

## 2019-01-18 IMAGING — CT CT MAXILLOFACIAL W/O CM
3 series · 16 of 47 positions shown, 19 images · non-contrast
Comparison: None.

CLINICAL DATA: Recent fall with right facial pain, initial
encounter

EXAM:
CT MAXILLOFACIAL WITHOUT CONTRAST
TECHNIQUE: Multidetector CT imaging of the maxillofacial structures was
performed. Multiplanar CT image reconstructions were also generated.

[Series 3: max soft · axial · 0.33mm/px · z∈[-194,-68]mm · 10 of 73 slices shown, 13 images]
[im 5/73  brain]
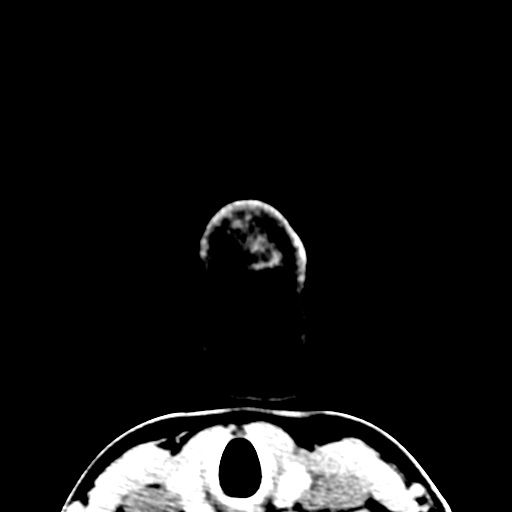
[im 5/73  bone]
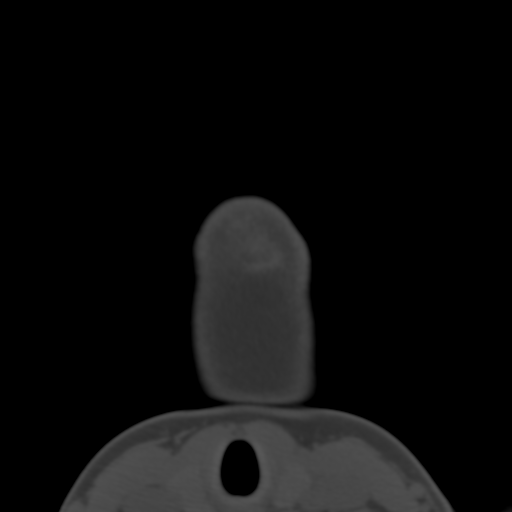
[im 13/73  bone]
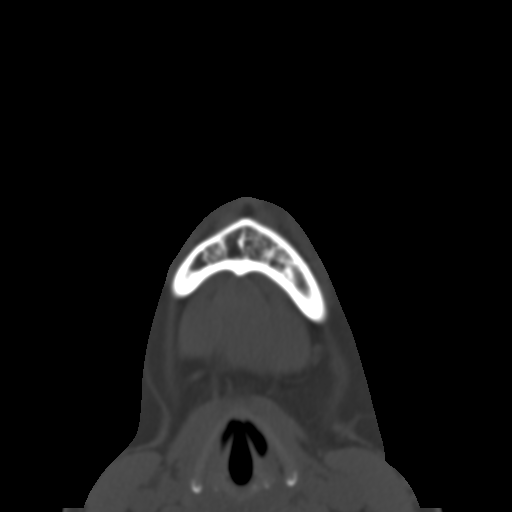
[im 20/73  bone]
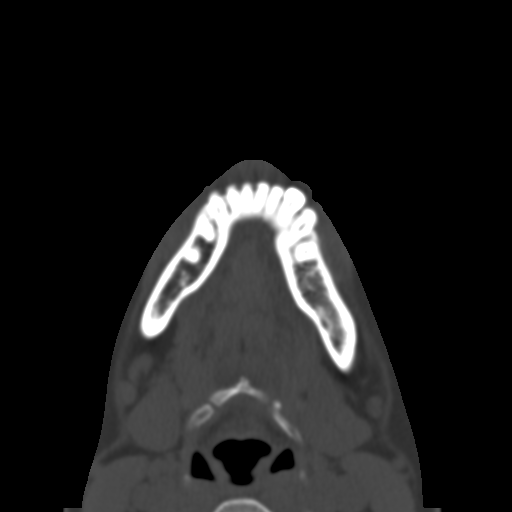
[im 25/73  bone]
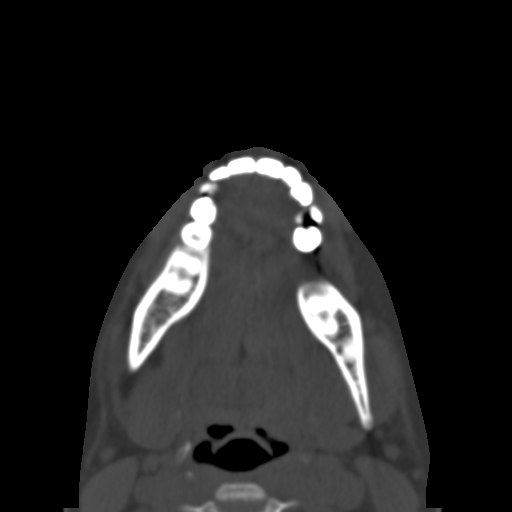
[im 33/73  brain]
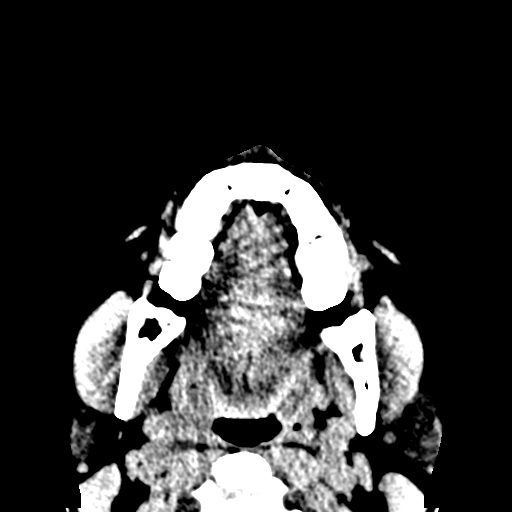
[im 33/73  bone]
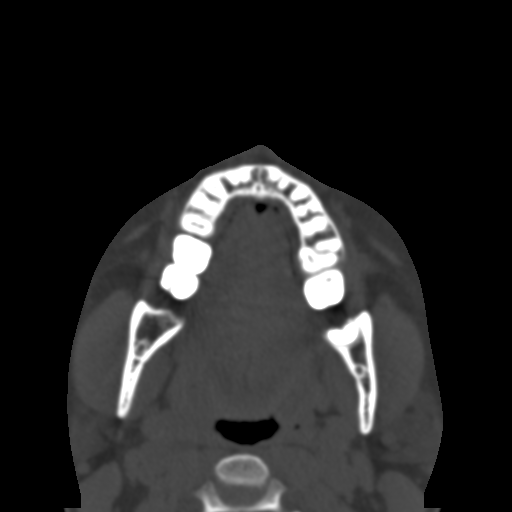
[im 40/73  bone]
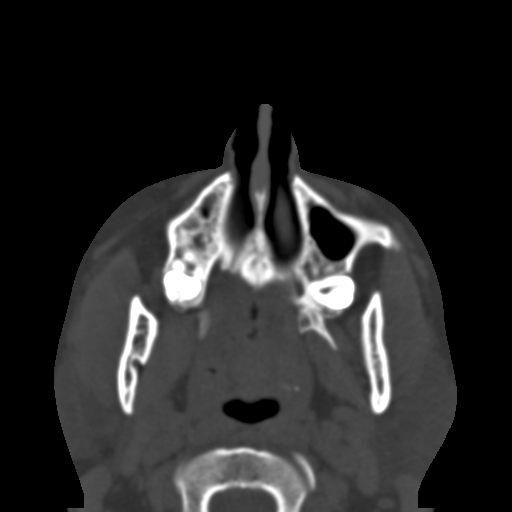
[im 48/73  bone]
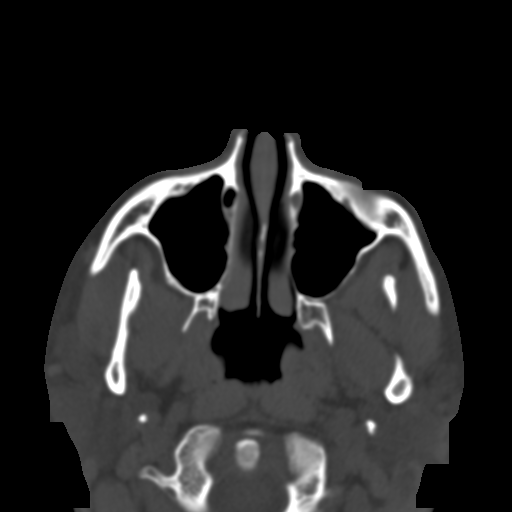
[im 55/73  bone]
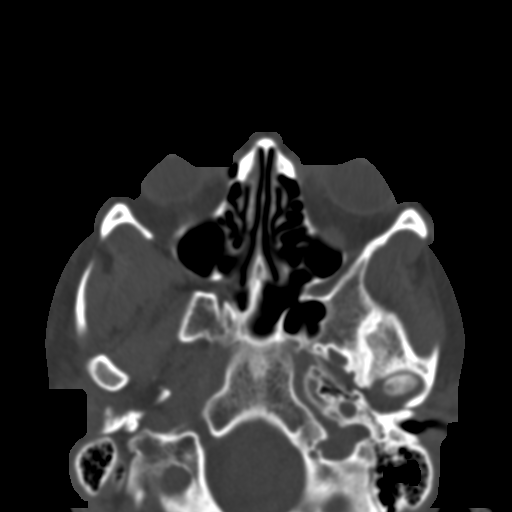
[im 60/73  brain]
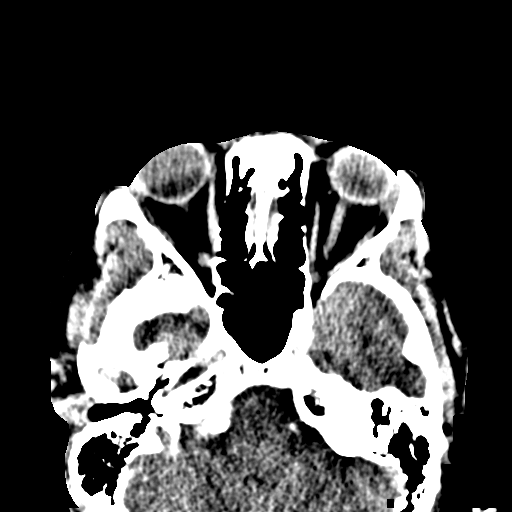
[im 60/73  bone]
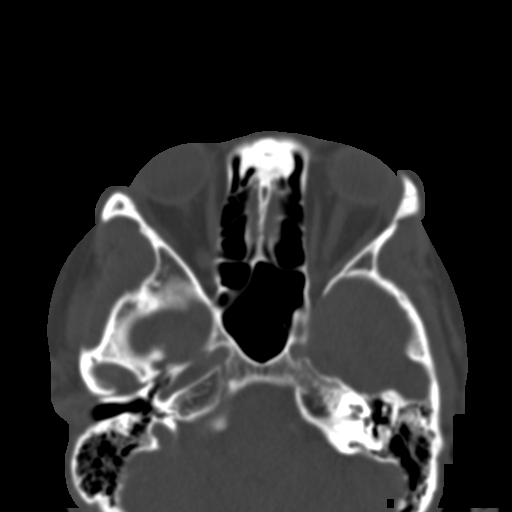
[im 68/73  bone]
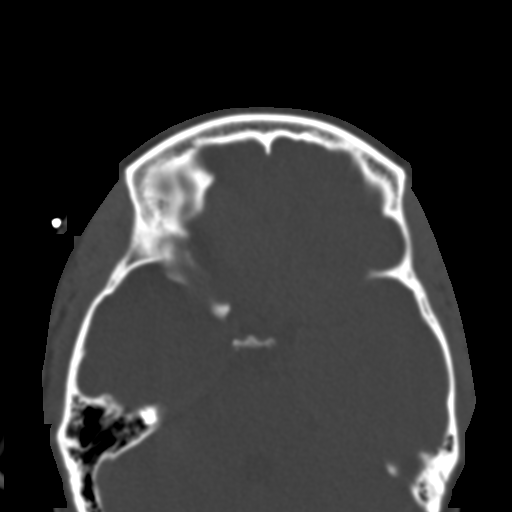

[Series 7: coronal soft · coronal · 0.29mm/px · 3 of 66 slices shown]
[im 29/66  bone]
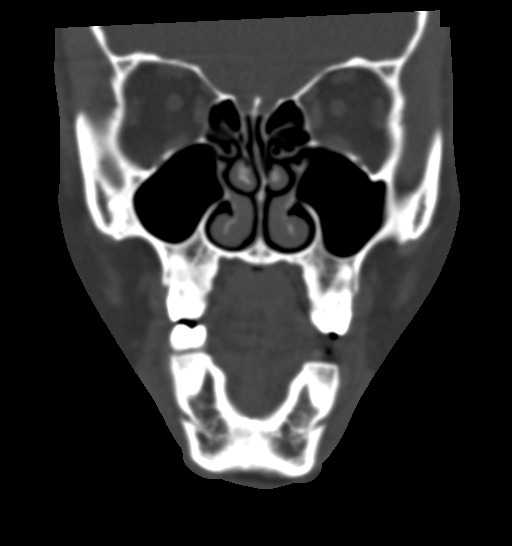
[im 37/66  bone]
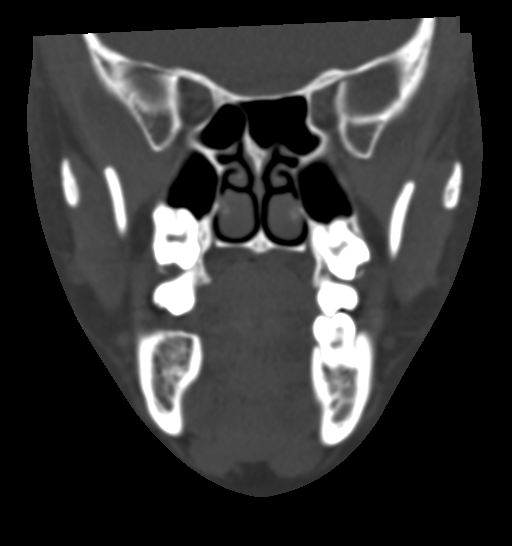
[im 44/66  bone]
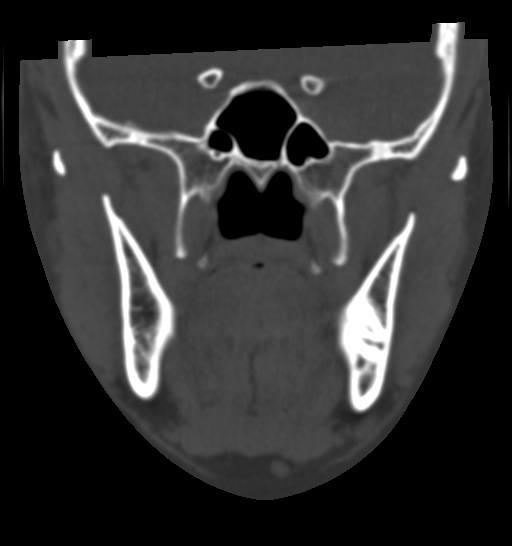

[Series 8: sagittal soft · sagittal · 0.26mm/px · 3 of 75 slices shown]
[im 25/75  bone]
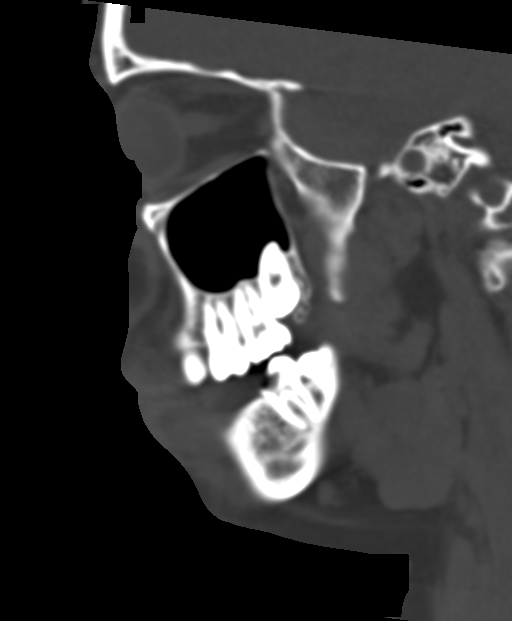
[im 38/75  bone]
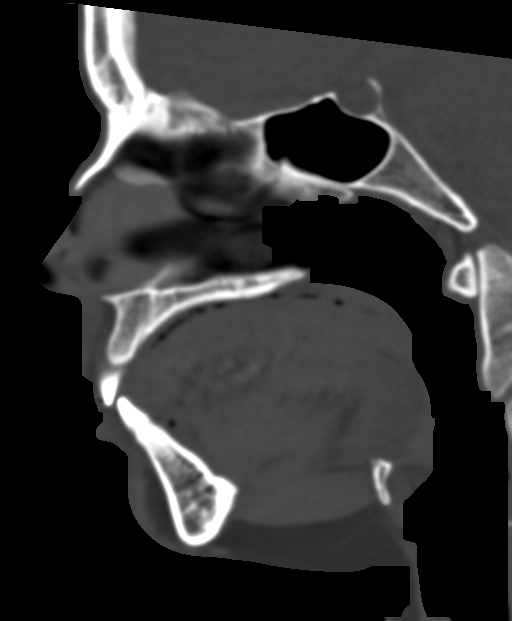
[im 50/75  bone]
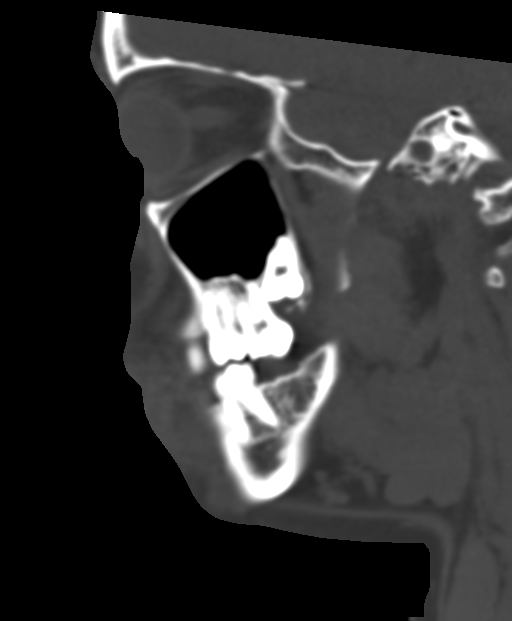

[16 of 47 positions shown; findings below may reference images not displayed]

FINDINGS: Osseous: Bony structures are within normal limits. No acute fracture
is noted. Dental caries are noted particularly on the left involving
the left second mandibular molar.

Orbits: Negative. No traumatic or inflammatory finding.

Sinuses: Clear.

Soft tissues: Negative.

Limited intracranial: No significant or unexpected finding.
IMPRESSION: No acute bony or soft tissue abnormality is noted. Dental caries are
noted on the left as described.

## 2020-02-12 ENCOUNTER — Ambulatory Visit: Payer: BC Managed Care – PPO | Attending: Internal Medicine

## 2020-02-12 DIAGNOSIS — Z23 Encounter for immunization: Secondary | ICD-10-CM | POA: Insufficient documentation

## 2020-02-12 NOTE — Progress Notes (Signed)
   Covid-19 Vaccination Clinic  Name:  Ruth Robles    MRN: [id number] DOB: Aug 21, 1991  02/12/2020  [name] was observed post Covid-19 immunization for 15 minutes without incidence. She was provided with Vaccine Information Sheet and instruction to access the V-Safe system.   [name] was instructed to call 911 with any severe reactions post vaccine: Marland Kitchen Difficulty breathing  . Swelling of your face and throat  . A fast heartbeat  . A bad rash all over your body  . Dizziness and weakness    Immunizations Administered    Name Date Dose VIS Date Route   Pfizer COVID-19 Vaccine 02/12/2020  5:48 PM 0.3 mL 11/26/2019 Intramuscular   Manufacturer: [employer], [employer]   Lot: [id number]   NDC: [address]-6

## 2020-03-04 ENCOUNTER — Ambulatory Visit: Payer: BC Managed Care – PPO | Attending: Internal Medicine

## 2020-03-04 DIAGNOSIS — Z23 Encounter for immunization: Secondary | ICD-10-CM

## 2020-03-04 NOTE — Progress Notes (Signed)
   Covid-19 Vaccination Clinic  Name:  Deserie Dirks    MRN: [id number] DOB: 07-06-1991  03/04/2020  Ms. Balducci was observed post Covid-19 immunization for 15 minutes without incident. She was provided with Vaccine Information Sheet and instruction to access the V-Safe system.   Ms. Gatliff was instructed to call 911 with any severe reactions post vaccine: Marland Kitchen Difficulty breathing  . Swelling of face and throat  . A fast heartbeat  . A bad rash all over body  . Dizziness and weakness   Immunizations Administered    Name Date Dose VIS Date Route   Pfizer COVID-19 Vaccine 03/04/2020  9:19 AM 0.3 mL 11/26/2019 Intramuscular   Manufacturer: [employer], [employer]   Lot: [id number]   NDC: [address]-7
